# Patient Record
Sex: Male | Born: 1990 | Hispanic: Yes | Marital: Single
Health system: Southern US, Community
[De-identification: ages and names within clinical notes are randomized; demographics above are authoritative.]

## PROBLEM LIST (undated history)

## (undated) DIAGNOSIS — G43909 Migraine, unspecified, not intractable, without status migrainosus: Secondary | ICD-10-CM

## (undated) HISTORY — PX: GASTRIC BYPASS: SHX52

---

## 2020-04-09 ENCOUNTER — Emergency Department (HOSPITAL_COMMUNITY)
Admission: EM | Admit: 2020-04-09 | Discharge: 2020-04-09 | Disposition: A | Discharge status: Home / Self Care | Payer: Self-pay | Attending: Emergency Medicine | Admitting: Emergency Medicine

## 2020-04-09 ENCOUNTER — Emergency Department (HOSPITAL_COMMUNITY): Payer: Self-pay

## 2020-04-09 ENCOUNTER — Other Ambulatory Visit: Payer: Self-pay

## 2020-04-09 ENCOUNTER — Encounter (HOSPITAL_COMMUNITY): Payer: Self-pay

## 2020-04-09 DIAGNOSIS — E860 Dehydration: Secondary | ICD-10-CM | POA: Insufficient documentation

## 2020-04-09 DIAGNOSIS — R079 Chest pain, unspecified: Secondary | ICD-10-CM | POA: Insufficient documentation

## 2020-04-09 HISTORY — DX: Migraine, unspecified, not intractable, without status migrainosus: G43.909

## 2020-04-09 LAB — CBC
HCT: 50.5 % (ref 39.0–52.0)
Hemoglobin: 17.4 g/dL — ABNORMAL HIGH (ref 13.0–17.0)
MCH: 30.6 pg (ref 26.0–34.0)
MCHC: 34.5 g/dL (ref 30.0–36.0)
MCV: 88.9 fL (ref 80.0–100.0)
Platelets: 177 10*3/uL (ref 150–400)
RBC: 5.68 MIL/uL (ref 4.22–5.81)
RDW: 14.6 % (ref 11.5–15.5)
WBC: 6 10*3/uL (ref 4.0–10.5)
nRBC: 0 % (ref 0.0–0.2)

## 2020-04-09 LAB — TROPONIN I (HIGH SENSITIVITY)
Troponin I (High Sensitivity): 5 ng/L (ref <18)
Troponin I (High Sensitivity): 5 ng/L (ref <18)

## 2020-04-09 LAB — BASIC METABOLIC PANEL
Anion gap: 16 — ABNORMAL HIGH (ref 5–15)
BUN: 6 mg/dL (ref 6–20)
CO2: 23 mmol/L (ref 22–32)
Calcium: 9.2 mg/dL (ref 8.9–10.3)
Chloride: 102 mmol/L (ref 98–111)
Creatinine, Ser: 0.79 mg/dL (ref 0.61–1.24)
GFR calc Af Amer: >60 mL/min (ref >60)
GFR calc non Af Amer: >60 mL/min (ref >60)
Glucose, Bld: 97 mg/dL (ref 70–99)
Potassium: 2.9 mmol/L — ABNORMAL LOW (ref 3.5–5.1)
Sodium: 141 mmol/L (ref 135–145)

## 2020-04-09 LAB — D-DIMER, QUANTITATIVE: D-Dimer, Quant: 0.52 ug/mL-FEU — ABNORMAL HIGH (ref 0.00–0.50)

## 2020-04-09 MED ORDER — ONDANSETRON 4 MG PO TBDP: 4.0000 mg | ORAL_TABLET | Freq: Three times a day (TID) | ORAL | 0 refills | Status: AC | PRN

## 2020-04-09 MED ORDER — ONDANSETRON HCL 4 MG/2ML IJ SOLN
4.0000 mg | Freq: Once | INTRAMUSCULAR | Status: AC
  Administered 2020-04-09: 4 mg via INTRAVENOUS
  Filled 2020-04-09: qty 2

## 2020-04-09 MED ORDER — POTASSIUM CHLORIDE CRYS ER 20 MEQ PO TBCR
40.0000 meq | EXTENDED_RELEASE_TABLET | Freq: Once | ORAL | Status: AC
  Administered 2020-04-09: 40 meq via ORAL
  Filled 2020-04-09: qty 2

## 2020-04-09 MED ORDER — SODIUM CHLORIDE (PF) 0.9 % IJ SOLN
INTRAMUSCULAR | Status: AC
  Filled 2020-04-09: qty 50

## 2020-04-09 MED ORDER — SODIUM CHLORIDE 0.9 % IV BOLUS
1000.0000 mL | Freq: Once | INTRAVENOUS | Status: AC
  Administered 2020-04-09: 1000 mL via INTRAVENOUS

## 2020-04-09 MED ORDER — IOHEXOL 350 MG/ML SOLN
100.0000 mL | Freq: Once | INTRAVENOUS | Status: AC | PRN
  Administered 2020-04-09: 84 mL via INTRAVENOUS

## 2020-04-09 MED ORDER — SODIUM CHLORIDE 0.9% FLUSH: 3.0000 mL | Freq: Once | INTRAVENOUS | Status: DC

## 2020-04-09 MED ORDER — FENTANYL CITRATE (PF) 100 MCG/2ML IJ SOLN
50.0000 ug | Freq: Once | INTRAMUSCULAR | Status: AC
  Administered 2020-04-09: 50 ug via INTRAVENOUS
  Filled 2020-04-09: qty 2

## 2020-04-09 NOTE — ED Triage Notes (Signed)
Patient arrived stating that yesterday he began vomiting, feeling dizzy, and feels like his heart is racing. Reports some right sided chest pain

## 2020-04-09 NOTE — ED Provider Notes (Signed)
[facility]-EMERGENCY DEPT [facility] Emergency Department Provider Note MRN:  [id number]  Arrival date & time: 04/09/20     Chief Complaint   Chest Pain   History of Present Illness   Timothy Sanders is a 29 y.o. year-old male with a history of migraine presenting to the ED with chief complaint of chest pain.  Dizziness described as a lightheadedness for the past 2 days, followed by shortness of breath and today with left-sided chest pain.  Mild to moderate in severity.  Also endorsing nausea and few episodes of nonbloody nonbilious emesis.  Denies headache, no abdominal pain, no pain or swelling to the arms or legs.  Denies drugs, only occasional alcohol.  Review of Systems  A complete 10 system review of systems was obtained and all systems are negative except as noted in the HPI and PMH.   Patient's Health History    Past Medical History:  Diagnosis Date  . Migraine     Past Surgical History:  Procedure Laterality Date  . GASTRIC BYPASS      No family history on file.  Social History   Socioeconomic History  . Marital status: Single    Spouse name: Not on file  . Number of children: Not on file  . Years of education: Not on file  . Highest education level: Not on file  Occupational History  . Not on file  Tobacco Use  . Smoking status: Not on file  Substance and Sexual Activity  . Alcohol use: Not on file  . Drug use: Not on file  . Sexual activity: Not on file  Other Topics Concern  . Not on file  Social History Narrative  . Not on file   Social Determinants of Health   Financial Resource Strain:   . Difficulty of Paying Living Expenses:   Food Insecurity:   . Worried About Programme researcher, broadcasting/film/video in the Last Year:   . Barista in the Last Year:   Transportation Needs:   . Freight forwarder (Medical):   Marland Kitchen Lack of Transportation (Non-Medical):   Physical Activity:   . Days of Exercise per Week:   . Minutes of Exercise per Session:   Stress:   .  Feeling of Stress :   Social Connections:   . Frequency of Communication with Friends and Family:   . Frequency of Social Gatherings with Friends and Family:   . Attends Religious Services:   . Active Member of Clubs or Organizations:   . Attends Banker Meetings:   Marland Kitchen Marital Status:   Intimate Partner Violence:   . Fear of Current or Ex-Partner:   . Emotionally Abused:   Marland Kitchen Physically Abused:   . Sexually Abused:      Physical Exam   Vitals:   04/09/20 0812 04/09/20 0930  BP: (!) 173/112 139/84  Pulse: 72 64  Resp: (!) 24 14  Temp: 98 F (36.7 C)   SpO2: 100% 96%    CONSTITUTIONAL: Well-appearing, NAD NEURO:  Alert and oriented x 3, normal and symmetric strength and sensation, normal coordination, normal speech EYES:  eyes equal and reactive ENT/NECK:  no LAD, no JVD CARDIO: Regular rate, well-perfused, normal S1 and S2; tender to palpation to the left chest PULM:  CTAB no wheezing or rhonchi GI/GU:  normal bowel sounds, non-distended, non-tender MSK/SPINE:  No gross deformities, no edema SKIN:  no rash, atraumatic PSYCH:  Appropriate speech and behavior  *Additional and/or pertinent findings included in  MDM below  Diagnostic and Interventional Summary    EKG Interpretation  Date/Time:  April 09, 2020 at 10: 40: 21 Ventricular Rate:  80 PR Interval: 160   QRS Duration: 92 QT Interval:  398 QTC Calculation: 460 R Axis:     Text Interpretation: Sinus rhythm, normal intervals Confirm by Dr. Kennis Carina at 10:43 AM      Labs Reviewed  BASIC METABOLIC PANEL - Abnormal; Notable for the following components:      Result Value   Potassium 2.9 (*)    Anion gap 16 (*)    All other components within normal limits  CBC - Abnormal; Notable for the following components:   Hemoglobin 17.4 (*)    All other components within normal limits  D-DIMER, QUANTITATIVE (NOT AT [facility]) - Abnormal; Notable for the following components:   D-Dimer, Quant 0.52 (*)     All other components within normal limits  TROPONIN I (HIGH SENSITIVITY)  TROPONIN I (HIGH SENSITIVITY)    CT ANGIO CHEST PE W OR WO CONTRAST  Final Result    DG Chest 2 View  Final Result      Medications  sodium chloride flush (NS) 0.9 % injection 3 mL (0 mLs Intravenous Hold 04/09/20 0817)  sodium chloride (PF) 0.9 % injection (has no administration in time range)  ondansetron (ZOFRAN) injection 4 mg (4 mg Intravenous Given 04/09/20 0839)  fentaNYL (SUBLIMAZE) injection 50 mcg (50 mcg Intravenous Given 04/09/20 0840)  sodium chloride 0.9 % bolus 1,000 mL (0 mLs Intravenous Stopped 04/09/20 1026)  potassium chloride SA (KLOR-CON) CR tablet 40 mEq (40 mEq Oral Given 04/09/20 0839)  iohexol (OMNIPAQUE) 350 MG/ML injection 100 mL (84 mLs Intravenous Contrast Given 04/09/20 1003)     Procedures  /  Critical Care Procedures  ED Course and Medical Decision Making  I have reviewed the triage vital signs, the nursing notes, and pertinent available records from the EMR.  Listed above are laboratory and imaging tests that I personally ordered, reviewed, and interpreted and then considered in my medical decision making (see below for details).      Considering MSK related chest pain versus dehydration versus pulmonary embolism.  Also considering hypertensive urgency or emergency.  Reassuring neurological exam at this time.  Will attempt symptomatic management and reevaluate blood pressure to determine need for blood pressure management with antihypertensives.  Screening with D-dimer as patient has very little risk of VTE.  D-dimer positive, CTA negative.  Patient continues to have normal vital signs, feeling general malaise but otherwise in no acute distress.  Work-up is negative, troponin negative x2, received further history from patient's brother at bedside, have been binge drinking over the weekend, suspect this is related as well as dehydration.  Continues to have normal neurological exam with  no focal deficits.  Appropriate for discharge.  Elmer Sow. Pilar Plate, MD [facility] Emergency Medicine [facility] [email or web address]  Final Clinical Impressions(s) / ED Diagnoses     ICD-10-CM   1. Chest pain, unspecified type  R07.9   2. Dehydration  E86.0     ED Discharge Orders         Ordered    ondansetron (ZOFRAN ODT) 4 MG disintegrating tablet  Every 8 hours PRN     Discontinue  Reprint     04/09/20 1042           Discharge Instructions Discussed with and Provided to Patient:     Discharge Instructions     You  were evaluated in the Emergency Department and after careful evaluation, we did not find any emergent condition requiring admission or further testing in the hospital.  Your exam/testing today was overall reassuring.  Symptoms seem to be due to dehydration.  Please return to the Emergency Department if you experience any worsening of your condition.  Thank you for allowing [place] to be a part of your care.        Sabas Sous, MD 04/09/20 1044

## 2020-04-09 NOTE — Discharge Instructions (Signed)
You were evaluated in the Emergency Department and after careful evaluation, we did not find any emergent condition requiring admission or further testing in the hospital.  Your exam/testing today was overall reassuring.  Symptoms seem to be due to dehydration.  Please return to the Emergency Department if you experience any worsening of your condition.  Thank you for allowing us to be a part of your care.  

## 2021-04-03 IMAGING — CT CT ANGIO CHEST
2 of 6 series · 18 of 36 positions shown · IV contrast (omnipaque)
Comparison: None.

CLINICAL DATA: Chest pain and shortness of breath. Elevated
D-dimer.

EXAM:
CT ANGIOGRAPHY CHEST WITH CONTRAST
TECHNIQUE: Multidetector CT imaging of the chest was performed using the
standard protocol during bolus administration of intravenous
contrast. Multiplanar CT image reconstructions and MIPs were
obtained to evaluate the vascular anatomy.
CONTRAST:  84mL OMNIPAQUE IOHEXOL 350 MG/ML SOLN

[Series 5: thins · axial · 0.82mm/px · z∈[-275,-46]mm · 17 of 259 slices shown]
[im 15/259  lung]
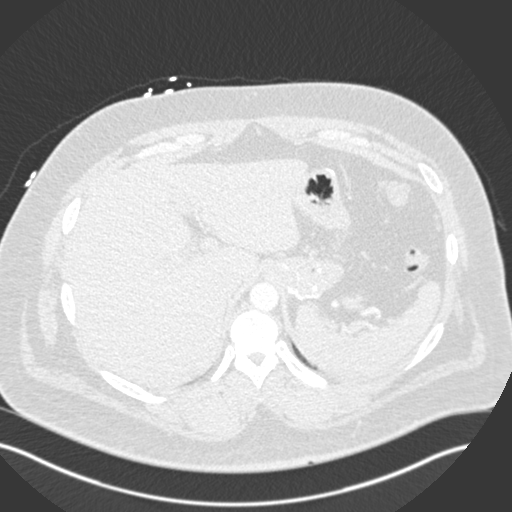
[im 29/259  mediastinal]
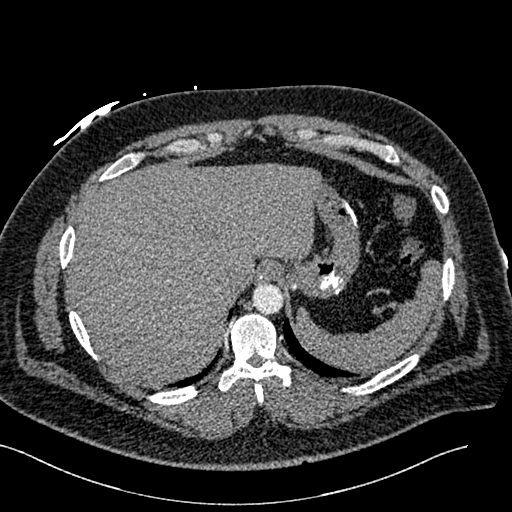
[im 44/259  lung]
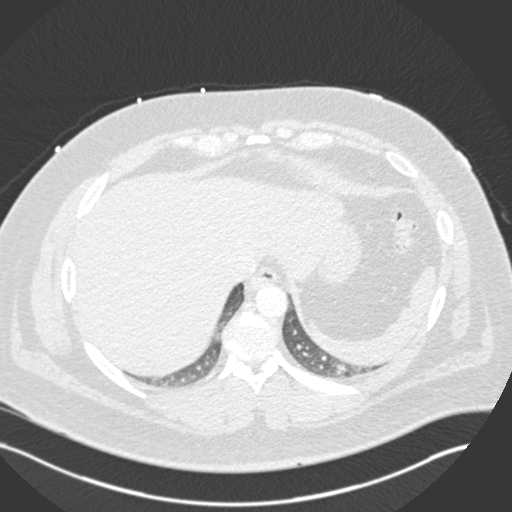
[im 58/259  mediastinal]
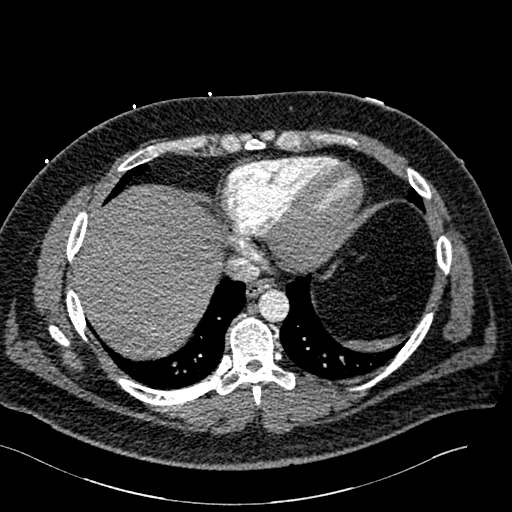
[im 72/259  lung]
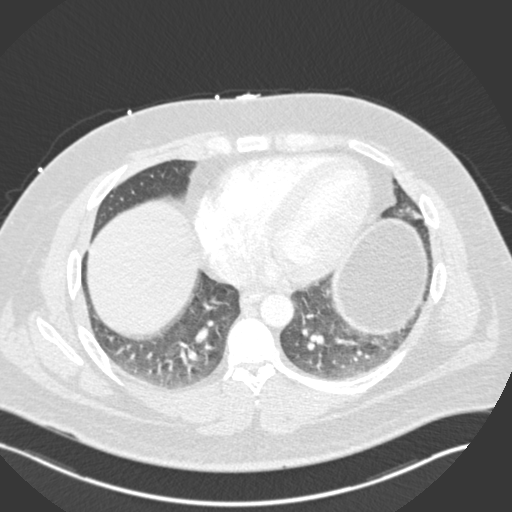
[im 87/259  mediastinal]
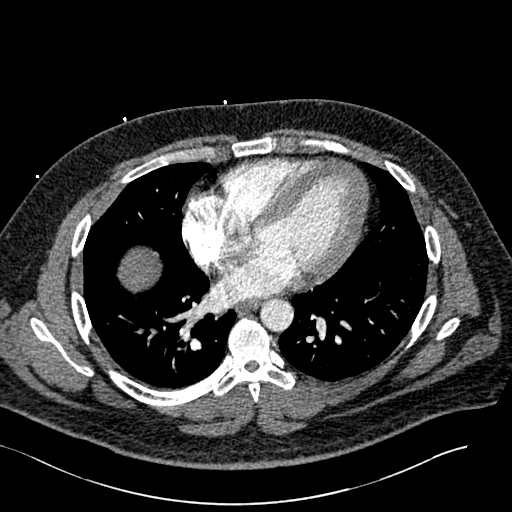
[im 101/259  lung]
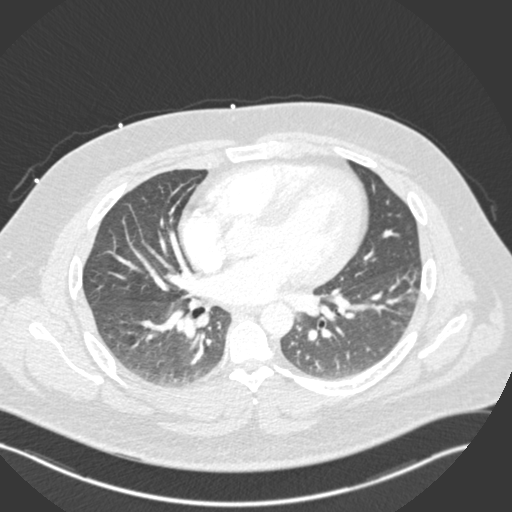
[im 115/259  mediastinal]
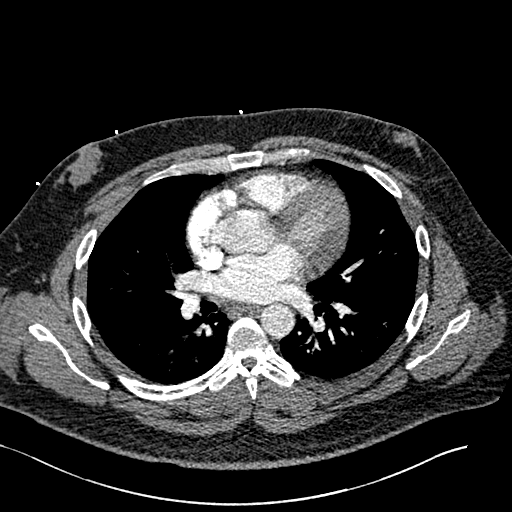
[im 130/259  lung]
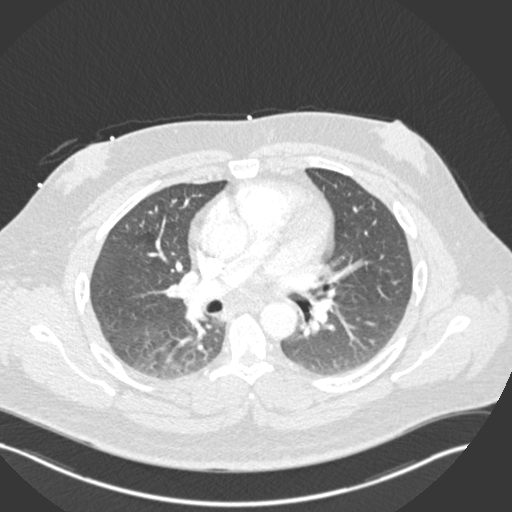
[im 144/259  mediastinal]
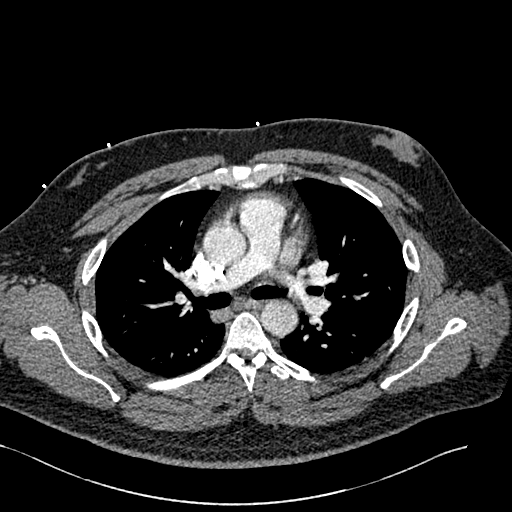
[im 158/259  lung]
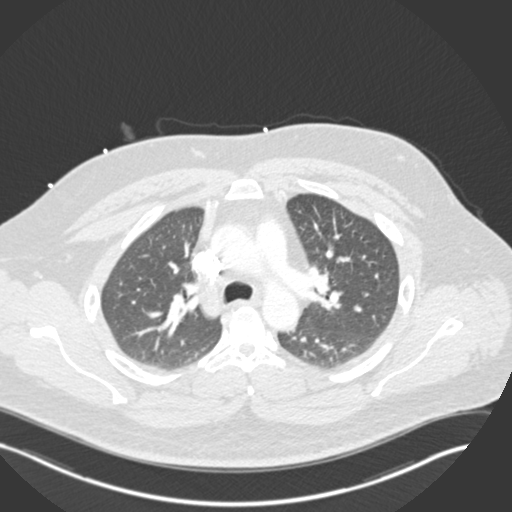
[im 173/259  mediastinal]
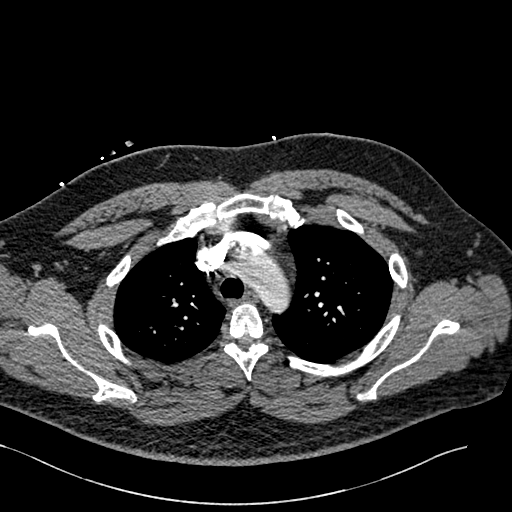
[im 187/259  lung]
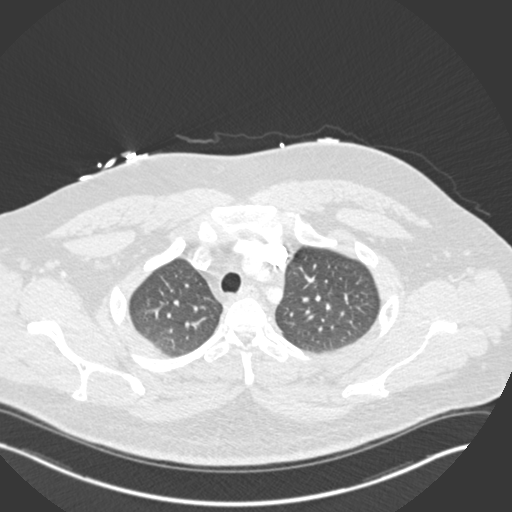
[im 201/259  mediastinal]
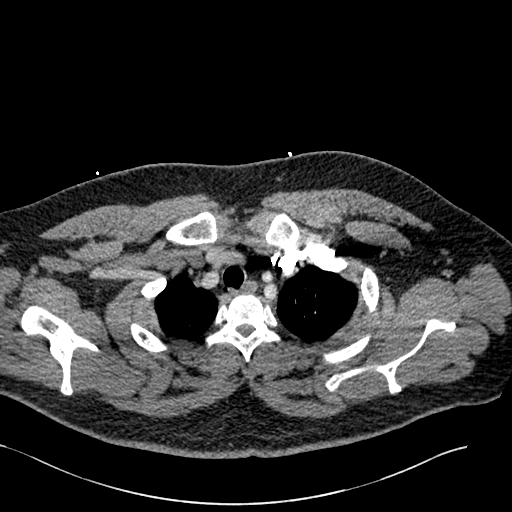
[im 216/259  lung]
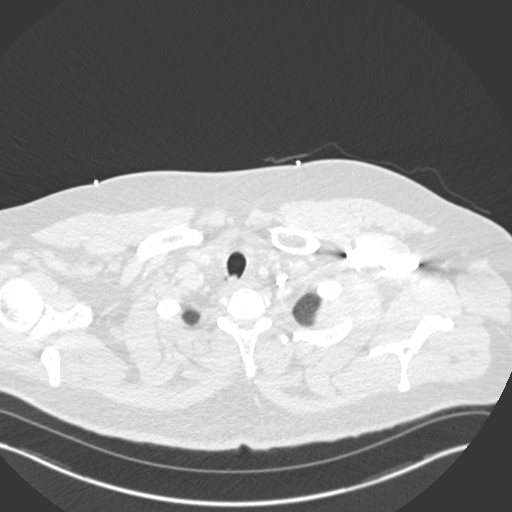
[im 230/259  mediastinal]
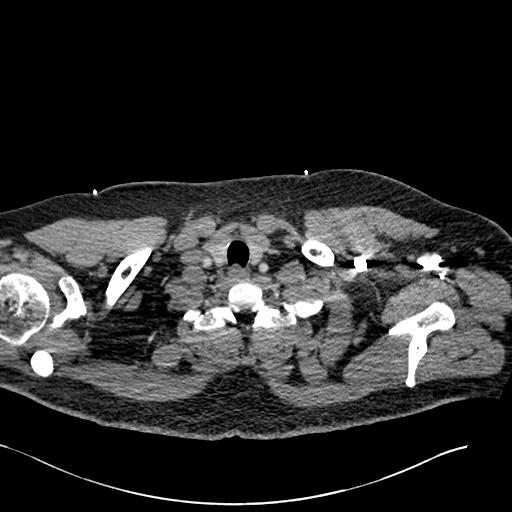
[im 244/259  lung]
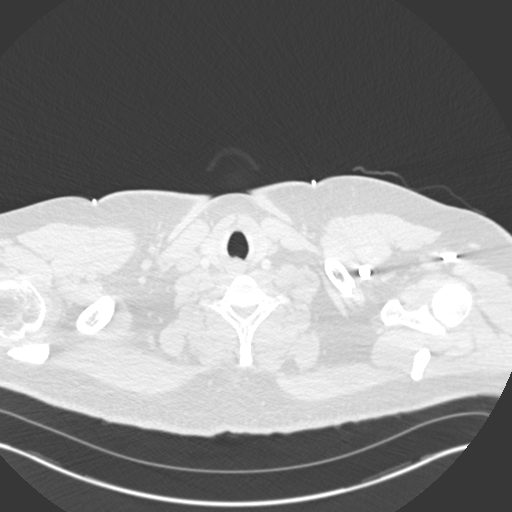

[Series 7: coronal mpr · coronal · 0.56mm/px · 1 of 163 slices shown]
[im 82/163  mediastinal]
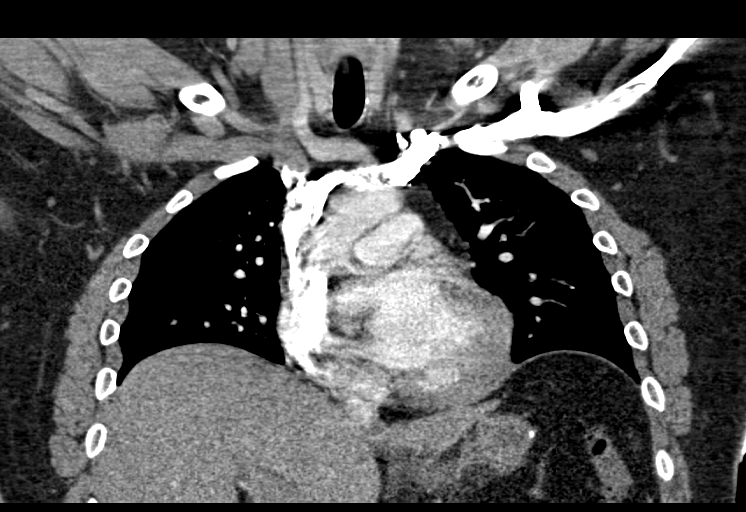

[18 of 36 positions shown; findings below may reference images not displayed]

FINDINGS: Cardiovascular: The heart is normal in size. No pericardial
effusion. The aorta is normal in caliber. No dissection. The branch
vessels are patent.

Suboptimal opacification of the pulmonary arteries but no definite
filling defects to suggest pulmonary embolism.

Mediastinum/Nodes: No mediastinal or hilar mass or adenopathy. The
esophagus is grossly normal.

Lungs/Pleura: No acute pulmonary findings. No infiltrates, edema or
effusions. Patchy dependent subpleural atelectasis noted.

Upper Abdomen: No significant upper abdominal findings. Surgical
changes noted from gastric sleeve procedure.

Musculoskeletal: Moderate symmetric bilateral gynecomastia.

The bony structures are unremarkable.

Review of the MIP images confirms the above findings.
IMPRESSION: 1. No CT findings for pulmonary embolism.
2. Normal thoracic aorta.
3. No acute pulmonary findings.
4. Moderate symmetric bilateral gynecomastia.

## 2021-04-03 IMAGING — CR DG CHEST 2V
2 series · 2 of 2 positions shown · non-contrast
Comparison: None.

CLINICAL DATA: Tachycardia, dizziness and right-sided chest pain.

EXAM:
CHEST - 2 VIEW

[w chest pa]
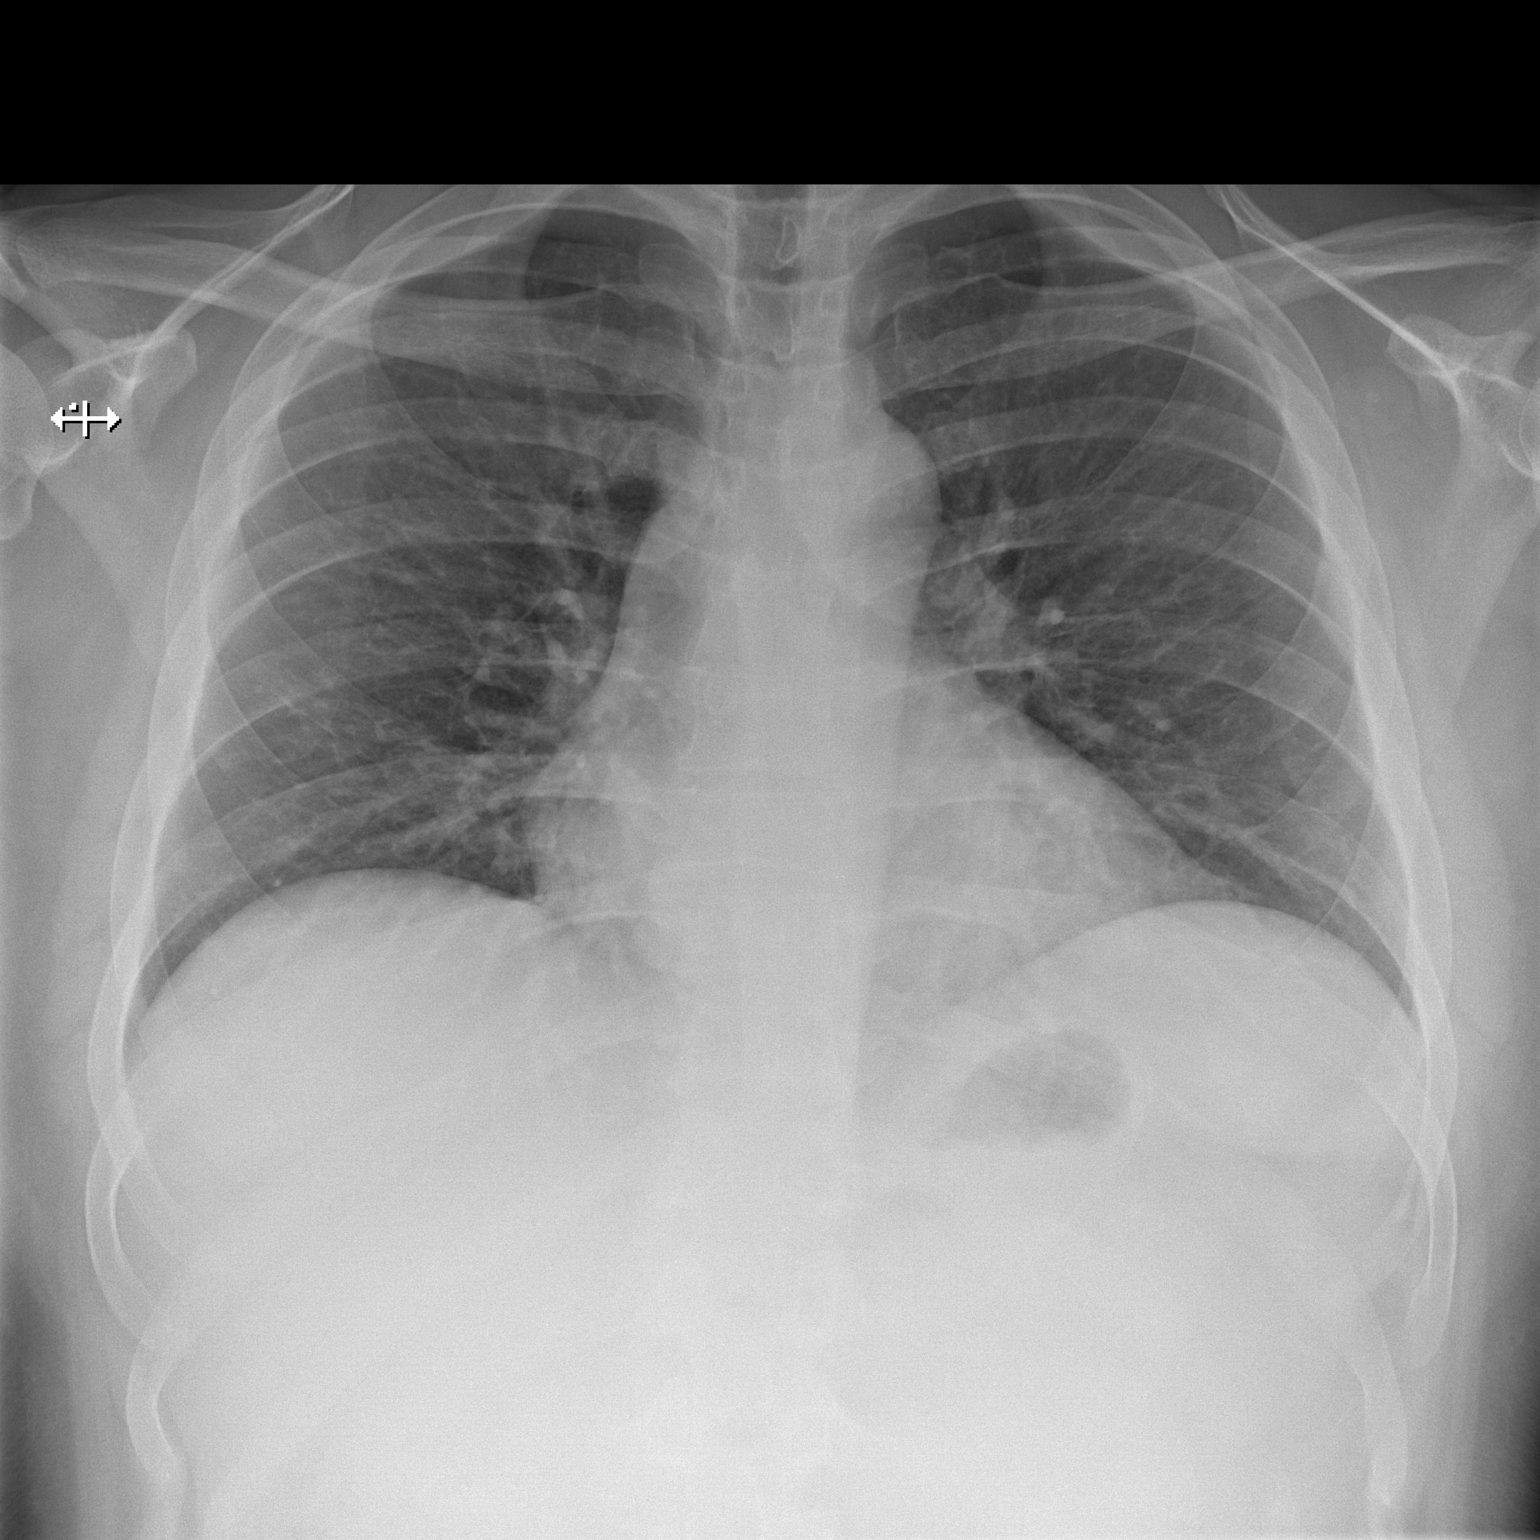

[w chest lat]
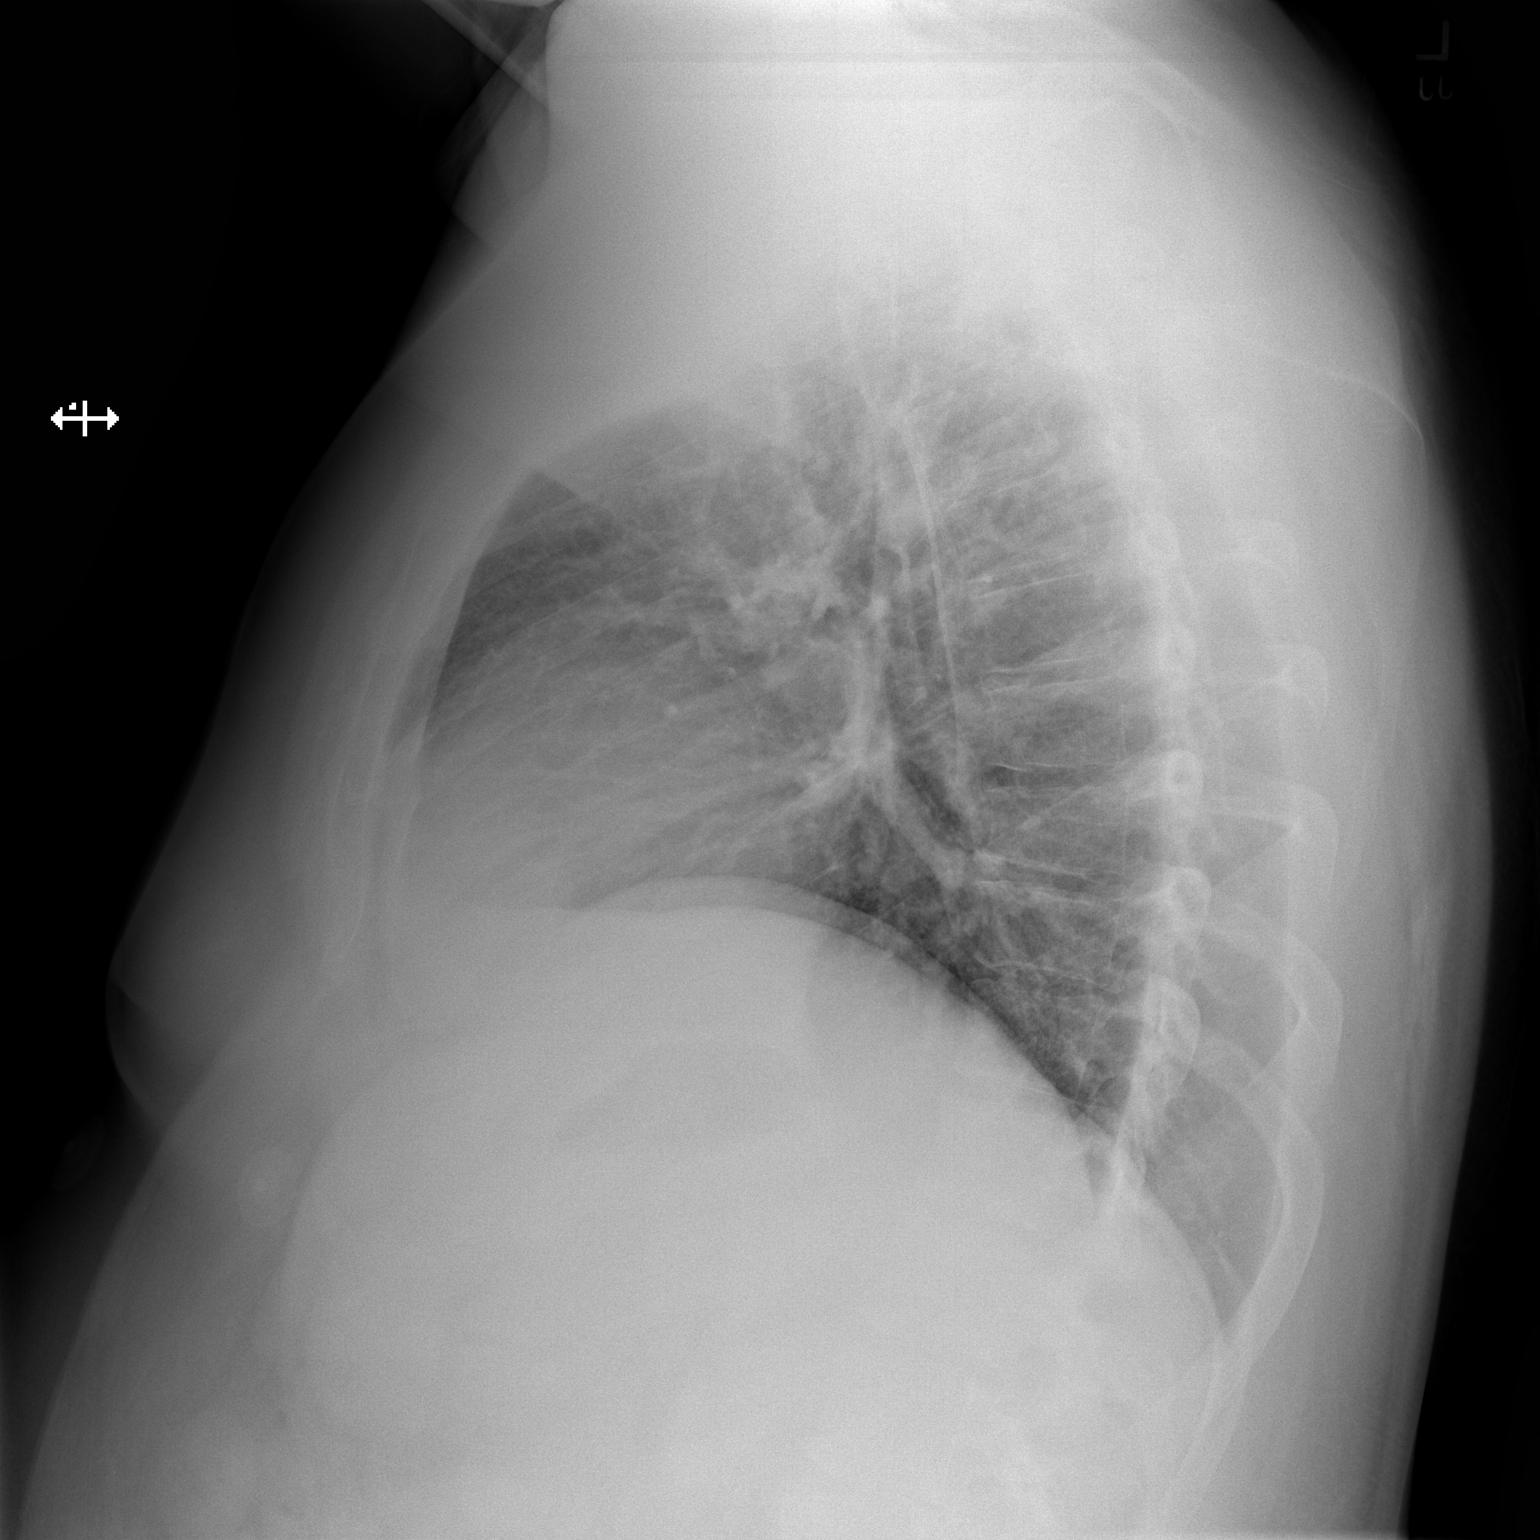

[2 of 2 positions shown; findings below may reference images not displayed]

FINDINGS: The cardiac silhouette, mediastinal and hilar contours are within
normal limits. The lungs are clear. No pleural effusions. No
pulmonary lesions. The bony thorax is intact.
IMPRESSION: No acute cardiopulmonary findings.
